# Patient Record
Sex: Female | Born: 1975 | Marital: Married | Smoking: Never smoker
Health system: Southern US, Community
[De-identification: ages and names within clinical notes are randomized; demographics above are authoritative.]

## PROBLEM LIST (undated history)

## (undated) DIAGNOSIS — J45909 Unspecified asthma, uncomplicated: Secondary | ICD-10-CM

## (undated) DIAGNOSIS — K589 Irritable bowel syndrome without diarrhea: Secondary | ICD-10-CM

## (undated) HISTORY — PX: TONSILLECTOMY: SUR1361

---

## 2019-04-29 ENCOUNTER — Ambulatory Visit: Payer: Self-pay | Admitting: Family Medicine

## 2019-04-29 DIAGNOSIS — Z0289 Encounter for other administrative examinations: Secondary | ICD-10-CM

## 2019-07-01 ENCOUNTER — Emergency Department (HOSPITAL_BASED_OUTPATIENT_CLINIC_OR_DEPARTMENT_OTHER): Payer: Commercial Managed Care - PPO

## 2019-07-01 ENCOUNTER — Other Ambulatory Visit: Payer: Self-pay

## 2019-07-01 ENCOUNTER — Emergency Department (HOSPITAL_BASED_OUTPATIENT_CLINIC_OR_DEPARTMENT_OTHER)
Admission: EM | Admit: 2019-07-01 | Discharge: 2019-07-01 | Disposition: A | Discharge status: Home / Self Care | Payer: Commercial Managed Care - PPO | Attending: Emergency Medicine | Admitting: Emergency Medicine

## 2019-07-01 ENCOUNTER — Encounter (HOSPITAL_BASED_OUTPATIENT_CLINIC_OR_DEPARTMENT_OTHER): Payer: Self-pay

## 2019-07-01 DIAGNOSIS — Z79899 Other long term (current) drug therapy: Secondary | ICD-10-CM | POA: Insufficient documentation

## 2019-07-01 DIAGNOSIS — J45909 Unspecified asthma, uncomplicated: Secondary | ICD-10-CM | POA: Diagnosis not present

## 2019-07-01 DIAGNOSIS — N12 Tubulo-interstitial nephritis, not specified as acute or chronic: Secondary | ICD-10-CM | POA: Insufficient documentation

## 2019-07-01 DIAGNOSIS — R109 Unspecified abdominal pain: Secondary | ICD-10-CM | POA: Diagnosis present

## 2019-07-01 HISTORY — DX: Unspecified asthma, uncomplicated: J45.909

## 2019-07-01 HISTORY — DX: Irritable bowel syndrome, unspecified: K58.9

## 2019-07-01 LAB — PREGNANCY, URINE: Preg Test, Ur: NEGATIVE

## 2019-07-01 LAB — CBC WITH DIFFERENTIAL/PLATELET
Abs Immature Granulocytes: 0.08 10*3/uL — ABNORMAL HIGH (ref 0.00–0.07)
Basophils Absolute: 0.1 10*3/uL (ref 0.0–0.1)
Basophils Relative: 0 %
Eosinophils Absolute: 0.4 10*3/uL (ref 0.0–0.5)
Eosinophils Relative: 3 %
HCT: 37.9 % (ref 36.0–46.0)
Hemoglobin: 13 g/dL (ref 12.0–15.0)
Immature Granulocytes: 1 %
Lymphocytes Relative: 21 %
Lymphs Abs: 2.7 10*3/uL (ref 0.7–4.0)
MCH: 31.5 pg (ref 26.0–34.0)
MCHC: 34.3 g/dL (ref 30.0–36.0)
MCV: 91.8 fL (ref 80.0–100.0)
Monocytes Absolute: 1 10*3/uL (ref 0.1–1.0)
Monocytes Relative: 7 %
Neutro Abs: 9.2 10*3/uL — ABNORMAL HIGH (ref 1.7–7.7)
Neutrophils Relative %: 68 %
Platelets: 323 10*3/uL (ref 150–400)
RBC: 4.13 MIL/uL (ref 3.87–5.11)
RDW: 12.8 % (ref 11.5–15.5)
WBC: 13.4 10*3/uL — ABNORMAL HIGH (ref 4.0–10.5)
nRBC: 0 % (ref 0.0–0.2)

## 2019-07-01 LAB — URINALYSIS, MICROSCOPIC (REFLEX)
RBC / HPF: >50 RBC/hpf (ref 0–5)
WBC, UA: >50 WBC/hpf (ref 0–5)

## 2019-07-01 LAB — URINALYSIS, ROUTINE W REFLEX MICROSCOPIC

## 2019-07-01 LAB — BASIC METABOLIC PANEL
Anion gap: 9 (ref 5–15)
BUN: 16 mg/dL (ref 6–20)
CO2: 22 mmol/L (ref 22–32)
Calcium: 9.2 mg/dL (ref 8.9–10.3)
Chloride: 110 mmol/L (ref 98–111)
Creatinine, Ser: 0.8 mg/dL (ref 0.44–1.00)
GFR calc Af Amer: >60 mL/min (ref >60)
GFR calc non Af Amer: >60 mL/min (ref >60)
Glucose, Bld: 95 mg/dL (ref 70–99)
Potassium: 4.2 mmol/L (ref 3.5–5.1)
Sodium: 141 mmol/L (ref 135–145)

## 2019-07-01 MED ORDER — ONDANSETRON 4 MG PO TBDP
8.0000 mg | ORAL_TABLET | Freq: Once | ORAL | Status: AC
  Administered 2019-07-01: 8 mg via ORAL
  Filled 2019-07-01: qty 2

## 2019-07-01 MED ORDER — PHENAZOPYRIDINE HCL 200 MG PO TABS: 200.0000 mg | ORAL_TABLET | Freq: Three times a day (TID) | ORAL | 0 refills | Status: AC | PRN

## 2019-07-01 MED ORDER — PHENAZOPYRIDINE HCL 100 MG PO TABS
95.0000 mg | ORAL_TABLET | Freq: Once | ORAL | Status: AC
  Administered 2019-07-01: 100 mg via ORAL
  Filled 2019-07-01: qty 1

## 2019-07-01 MED ORDER — CEPHALEXIN 500 MG PO CAPS: 500.0000 mg | ORAL_CAPSULE | Freq: Four times a day (QID) | ORAL | 0 refills | Status: AC

## 2019-07-01 MED ORDER — ONDANSETRON HCL 4 MG PO TABS
4.0000 mg | ORAL_TABLET | Freq: Four times a day (QID) | ORAL | 0 refills | Status: AC
Start: 2019-07-01 — End: ?

## 2019-07-01 MED ORDER — OXYCODONE-ACETAMINOPHEN 5-325 MG PO TABS
2.0000 | ORAL_TABLET | Freq: Once | ORAL | Status: AC
  Administered 2019-07-01: 2 via ORAL
  Filled 2019-07-01: qty 2

## 2019-07-01 MED ORDER — CEPHALEXIN 250 MG PO CAPS
1000.0000 mg | ORAL_CAPSULE | Freq: Once | ORAL | Status: AC
  Administered 2019-07-01: 1000 mg via ORAL
  Filled 2019-07-01: qty 4

## 2019-07-01 NOTE — ED Provider Notes (Signed)
[facility] EMERGENCY DEPARTMENT Provider Note   CSN: [id number] Arrival date & time: 07/01/19  0047     History Chief Complaint  Patient presents with  . Urinary Tract Infection    Jackie Costa is a 44 y.o. female.   Urinary Tract Infection Pain quality:  Stabbing and aching Pain severity:  Mild Duration:  3 days Timing:  Constant Progression:  Worsening Chronicity:  Recurrent Recent urinary tract infections: no   Ineffective treatments:  NSAIDs Urinary symptoms: hematuria   Associated symptoms: abdominal pain, flank pain and nausea   Associated symptoms: no vomiting   Risk factors: no hx of pyelonephritis        Past Medical History:  Diagnosis Date  . Asthma   . IBS (irritable bowel syndrome)    Constipation    There are no problems to display for this patient.   Past Surgical History:  Procedure Laterality Date  . CESAREAN SECTION    . TONSILLECTOMY       OB History   No obstetric history on file.     History reviewed. No pertinent family history.  Social History   Tobacco Use  . Smoking status: Never Smoker  . Smokeless tobacco: Never Used  Substance Use Topics  . Alcohol use: Not Currently  . Drug use: Not on file    Home Medications Prior to Admission medications   Medication Sig Start Date End Date Taking? Authorizing Provider  albuterol (VENTOLIN HFA) 108 (90 Base) MCG/ACT inhaler Inhale into the lungs every 6 (six) hours as needed for wheezing or shortness of breath.   Yes [provider]  montelukast (SINGULAIR) 10 MG tablet Take 10 mg by mouth at bedtime.   Yes [provider]  cephALEXin (KEFLEX) 500 MG capsule Take 1 capsule (500 mg total) by mouth 4 (four) times daily. 07/01/19   Milanna Kozlov, Barbara Cower, MD  ondansetron (ZOFRAN) 4 MG tablet Take 1 tablet (4 mg total) by mouth every 6 (six) hours. 07/01/19   Daylan Juhnke, Barbara Cower, MD  phenazopyridine (PYRIDIUM) 200 MG tablet Take 1 tablet (200 mg total) by mouth 3 (three)  times daily as needed for pain. 07/01/19   Tanaya Dunigan, Barbara Cower, MD    Allergies    Penicillins  Review of Systems   Review of Systems  Gastrointestinal: Positive for abdominal pain and nausea. Negative for vomiting.  Genitourinary: Positive for flank pain.  All other systems reviewed and are negative.   Physical Exam Updated Vital Signs BP 123/62 (BP Location: Left Arm)   Pulse 79   Temp 98.7 F (37.1 C)   Resp 16   Ht 5\' 4"  (1.626 m)   Wt 88.5 kg   LMP 06/09/2019 Comment: (-) urine preg//ac  SpO2 100%   BMI 33.47 kg/m   Physical Exam Vitals and nursing note reviewed.  Constitutional:      Appearance: She is well-developed.  HENT:     Head: Normocephalic and atraumatic.     Mouth/Throat:     Mouth: Mucous membranes are dry.     Pharynx: Oropharynx is clear.  Cardiovascular:     Rate and Rhythm: Normal rate and regular rhythm.  Pulmonary:     Effort: No respiratory distress.     Breath sounds: No stridor.  Abdominal:     General: There is no distension.  Musculoskeletal:        General: No swelling or tenderness. Normal range of motion.     Cervical back: Normal range of motion.  Skin:  General: Skin is warm and dry.  Neurological:     General: No focal deficit present.     Mental Status: She is alert.     ED Results / Procedures / Treatments   Labs (all labs ordered are listed, but only abnormal results are displayed) Labs Reviewed  URINALYSIS, ROUTINE W REFLEX MICROSCOPIC - Abnormal; Notable for the following components:      Result Value   Color, Urine RED (*)    APPearance TURBID (*)    Glucose, UA   (*)    Value: TEST NOT REPORTED DUE TO COLOR INTERFERENCE OF URINE PIGMENT   Hgb urine dipstick   (*)    Value: TEST NOT REPORTED DUE TO COLOR INTERFERENCE OF URINE PIGMENT   Bilirubin Urine   (*)    Value: TEST NOT REPORTED DUE TO COLOR INTERFERENCE OF URINE PIGMENT   Ketones, ur   (*)    Value: TEST NOT REPORTED DUE TO COLOR INTERFERENCE OF URINE  PIGMENT   Protein, ur   (*)    Value: TEST NOT REPORTED DUE TO COLOR INTERFERENCE OF URINE PIGMENT   Nitrite   (*)    Value: TEST NOT REPORTED DUE TO COLOR INTERFERENCE OF URINE PIGMENT   Leukocytes,Ua   (*)    Value: TEST NOT REPORTED DUE TO COLOR INTERFERENCE OF URINE PIGMENT   All other components within normal limits  URINALYSIS, MICROSCOPIC (REFLEX) - Abnormal; Notable for the following components:   Bacteria, UA MANY (*)    All other components within normal limits  CBC WITH DIFFERENTIAL/PLATELET - Abnormal; Notable for the following components:   WBC 13.4 (*)    Neutro Abs 9.2 (*)    Abs Immature Granulocytes 0.08 (*)    All other components within normal limits  BASIC METABOLIC PANEL  PREGNANCY, URINE    EKG None  Radiology CT Renal Stone Study  Result Date: 07/01/2019 CLINICAL DATA:  Flank pain and UTI EXAM: CT ABDOMEN AND PELVIS WITHOUT CONTRAST TECHNIQUE: Multidetector CT imaging of the abdomen and pelvis was performed following the standard protocol without IV contrast. COMPARISON:  None. FINDINGS: Lower chest: No acute abnormality. Hepatobiliary: No focal liver abnormality is seen. No gallstones, gallbladder wall thickening, or biliary dilatation. Pancreas: Unremarkable. No pancreatic ductal dilatation or surrounding inflammatory changes. Spleen: Normal in size without focal abnormality. Adrenals/Urinary Tract: Adrenal glands are within normal limits. Kidneys are well visualized. No definitive renal calculi are seen. The ureters are within normal limits bilaterally. The bladder is decompressed. Stomach/Bowel: The appendix is within normal limits. No obstructive or inflammatory changes of the colon are seen. The small bowel and stomach appear within normal limits Vascular/Lymphatic: No significant vascular findings are present. No enlarged abdominal or pelvic lymph nodes. Reproductive: Uterus is within normal limits. Cystic changes are noted in the ovaries. Surgical clip is noted  adjacent to the uterus likely related to the prior Caesarean section. Other: No abdominal wall hernia or abnormality. No abdominopelvic ascites. Musculoskeletal: No acute or significant osseous findings. IMPRESSION: No acute abnormality noted. Electronically Signed   By: Inez Catalina M.D.   On: 07/01/2019 03:55    Procedures Procedures (including critical care time)  Medications Ordered in ED Medications  oxyCODONE-acetaminophen (PERCOCET/ROXICET) 5-325 MG per tablet 2 tablet (2 tablets Oral Given 07/01/19 0221)  cephALEXin (KEFLEX) capsule 1,000 mg (1,000 mg Oral Given 07/01/19 0417)  phenazopyridine (PYRIDIUM) tablet 100 mg (100 mg Oral Given 07/01/19 0417)  ondansetron (ZOFRAN-ODT) disintegrating tablet 8 mg (8 mg Oral Given  07/01/19 0417)    ED Course  I have reviewed the triage vital signs and the nursing notes.  Pertinent labs & imaging results that were available during my care of the patient were reviewed by me and considered in my medical decision making (see chart for details).    MDM Rules/Calculators/A&P                      Likely pyelonephritis. No e/o complications. Tolerating PO. Symptoms improved in ED. VS WNL, no e/o sepsis. Stable for dc with pcp follow up, return for worsening symptoms.   Final Clinical Impression(s) / ED Diagnoses Final diagnoses:  Pyelonephritis    Rx / DC Orders ED Discharge Orders         Ordered    cephALEXin (KEFLEX) 500 MG capsule  4 times daily     07/01/19 0440    phenazopyridine (PYRIDIUM) 200 MG tablet  3 times daily PRN     07/01/19 0440    ondansetron (ZOFRAN) 4 MG tablet  Every 6 hours     07/01/19 0440           Lorraine Terriquez, Barbara Cower, MD 07/01/19 4854

## 2019-07-01 NOTE — ED Notes (Signed)
ED Provider at bedside. 

## 2019-07-01 NOTE — ED Triage Notes (Signed)
Pt states UTI pain ~4-5pm today. Urinating blood. Hx of UTI.

## 2019-08-05 ENCOUNTER — Other Ambulatory Visit (HOSPITAL_BASED_OUTPATIENT_CLINIC_OR_DEPARTMENT_OTHER): Payer: Self-pay | Admitting: Physician Assistant

## 2019-08-05 DIAGNOSIS — Z1231 Encounter for screening mammogram for malignant neoplasm of breast: Secondary | ICD-10-CM

## 2019-08-14 ENCOUNTER — Other Ambulatory Visit: Payer: Self-pay | Admitting: Obstetrics & Gynecology

## 2019-08-14 DIAGNOSIS — N979 Female infertility, unspecified: Secondary | ICD-10-CM

## 2019-09-15 ENCOUNTER — Other Ambulatory Visit: Payer: Commercial Managed Care - PPO

## 2019-09-16 ENCOUNTER — Emergency Department (HOSPITAL_BASED_OUTPATIENT_CLINIC_OR_DEPARTMENT_OTHER)
Admission: EM | Admit: 2019-09-16 | Discharge: 2019-09-16 | Disposition: A | Discharge status: Home / Self Care | Payer: Commercial Managed Care - PPO | Attending: Emergency Medicine | Admitting: Emergency Medicine

## 2019-09-16 ENCOUNTER — Other Ambulatory Visit: Payer: Self-pay

## 2019-09-16 ENCOUNTER — Encounter (HOSPITAL_BASED_OUTPATIENT_CLINIC_OR_DEPARTMENT_OTHER): Payer: Self-pay | Admitting: *Deleted

## 2019-09-16 DIAGNOSIS — Y999 Unspecified external cause status: Secondary | ICD-10-CM | POA: Insufficient documentation

## 2019-09-16 DIAGNOSIS — Z23 Encounter for immunization: Secondary | ICD-10-CM | POA: Insufficient documentation

## 2019-09-16 DIAGNOSIS — S0125XA Open bite of nose, initial encounter: Secondary | ICD-10-CM | POA: Diagnosis not present

## 2019-09-16 DIAGNOSIS — S0992XA Unspecified injury of nose, initial encounter: Secondary | ICD-10-CM | POA: Diagnosis present

## 2019-09-16 DIAGNOSIS — Y939 Activity, unspecified: Secondary | ICD-10-CM | POA: Diagnosis not present

## 2019-09-16 DIAGNOSIS — W540XXA Bitten by dog, initial encounter: Secondary | ICD-10-CM | POA: Diagnosis not present

## 2019-09-16 DIAGNOSIS — Y929 Unspecified place or not applicable: Secondary | ICD-10-CM | POA: Diagnosis not present

## 2019-09-16 NOTE — ED Triage Notes (Signed)
C/o dog bite to nose x 1 hr ago . 3 puncture wounds , bleeding controlled

## 2021-03-15 IMAGING — CT CT RENAL STONE PROTOCOL
2 of 4 series · 17 of 46 positions shown, 19 images · non-contrast
Comparison: None.

CLINICAL DATA: Flank pain and UTI

EXAM:
CT ABDOMEN AND PELVIS WITHOUT CONTRAST
TECHNIQUE: Multidetector CT imaging of the abdomen and pelvis was performed
following the standard protocol without IV contrast.

[Series 2: axial st · axial · 0.75mm/px · z∈[-443,-13]mm · 14 of 94 slices shown, 16 images]
[im 4/94  soft-tissue]
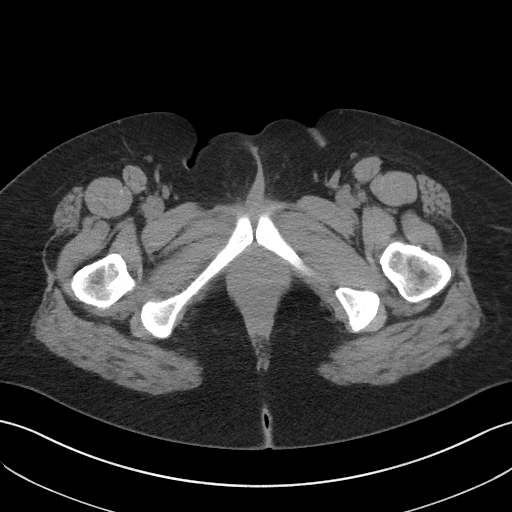
[im 4/94  bone]
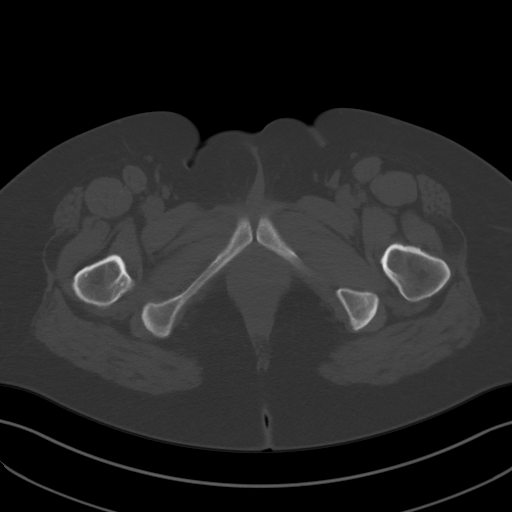
[im 12/94  soft-tissue]
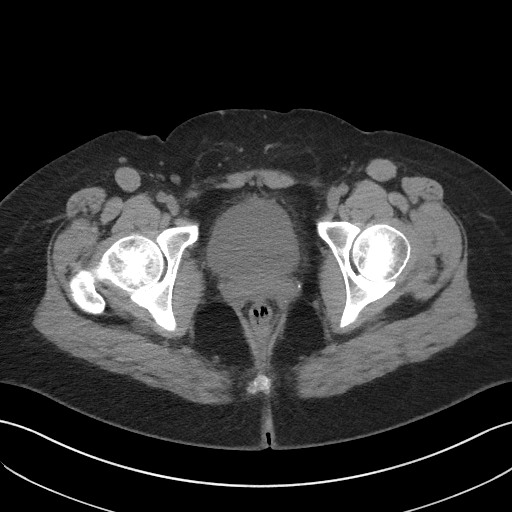
[im 20/94  soft-tissue]
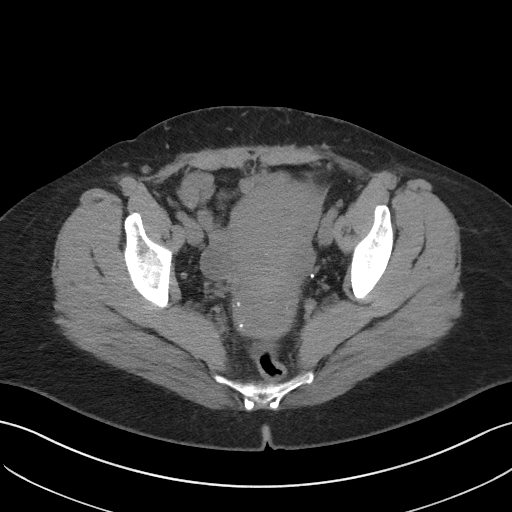
[im 24/94  soft-tissue]
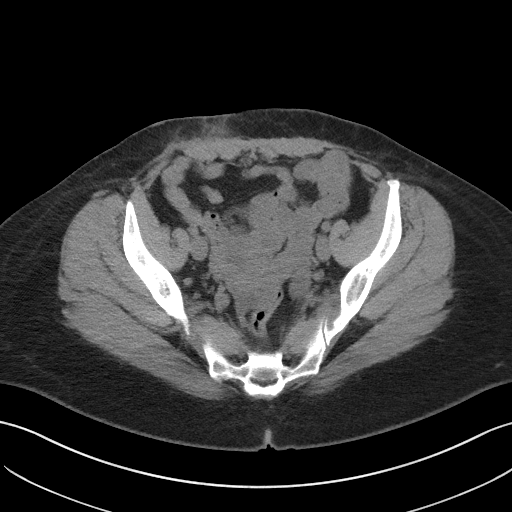
[im 32/94  soft-tissue]
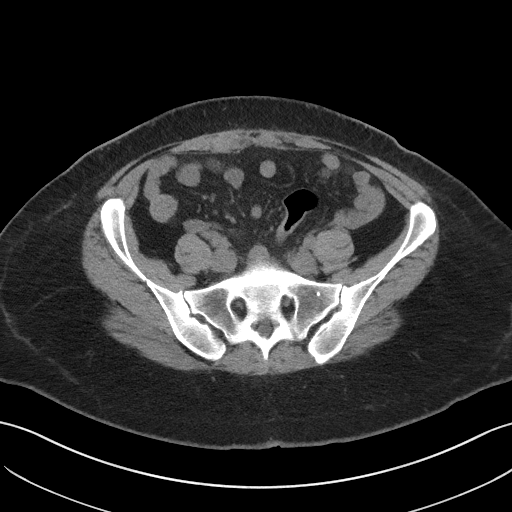
[im 39/94  soft-tissue]
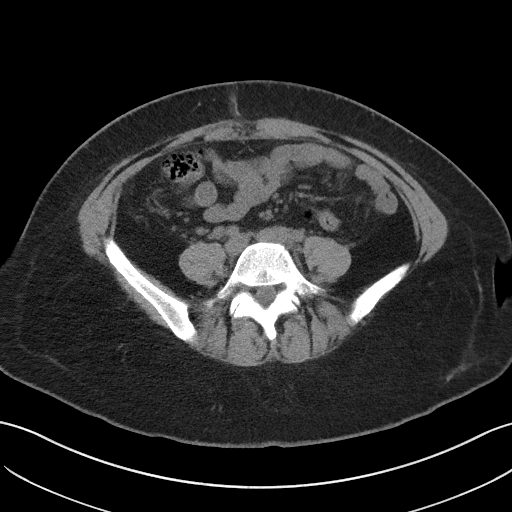
[im 43/94  soft-tissue]
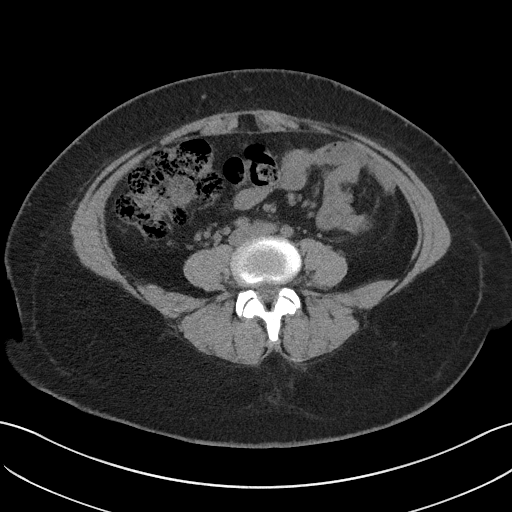
[im 51/94  soft-tissue]
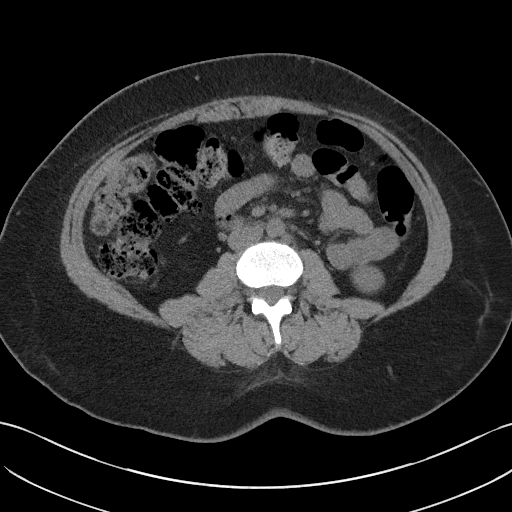
[im 55/94  soft-tissue]
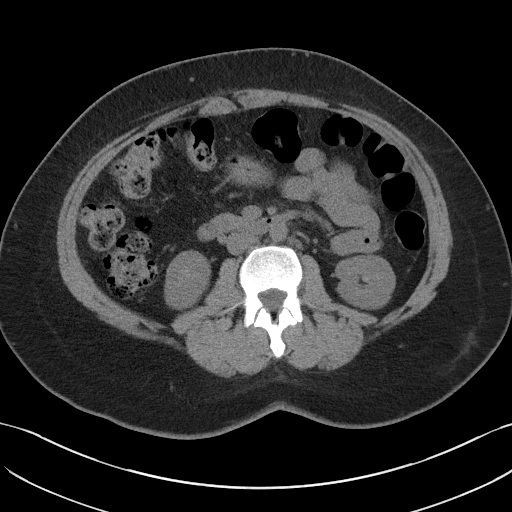
[im 55/94  bone]
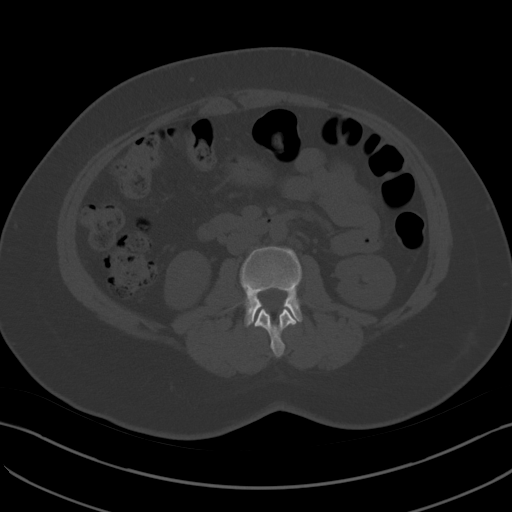
[im 63/94  soft-tissue]
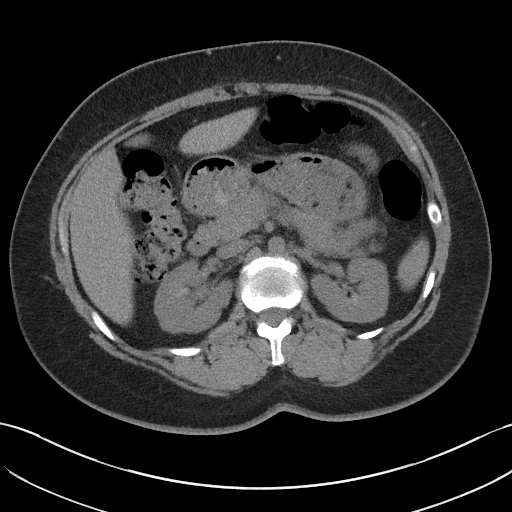
[im 70/94  soft-tissue]
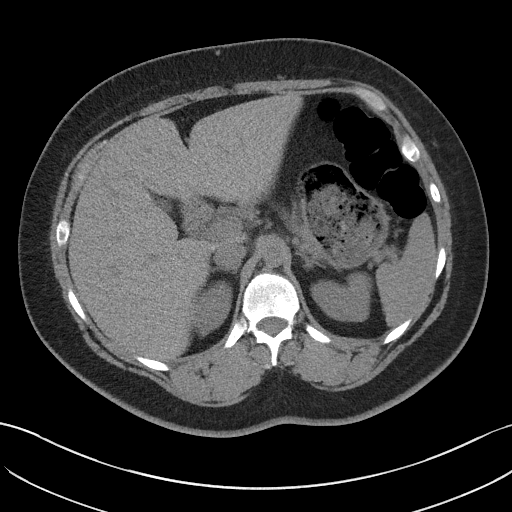
[im 74/94  soft-tissue]
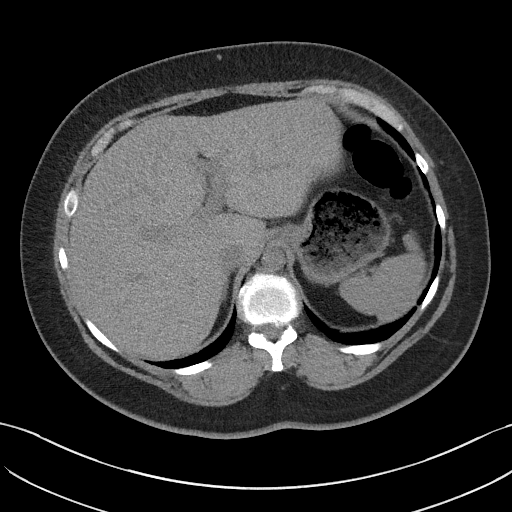
[im 82/94  soft-tissue]
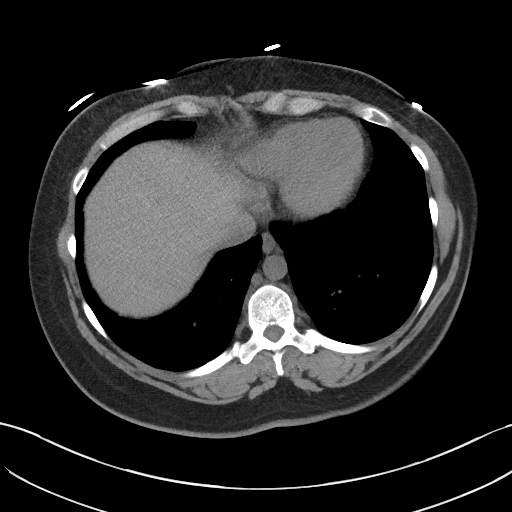
[im 90/94  soft-tissue]
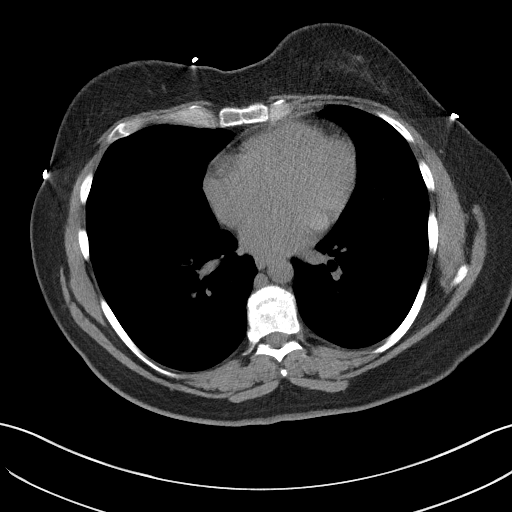

[Series 4: coronal st · coronal · 0.92mm/px · 3 of 83 slices shown]
[im 28/83  soft-tissue]
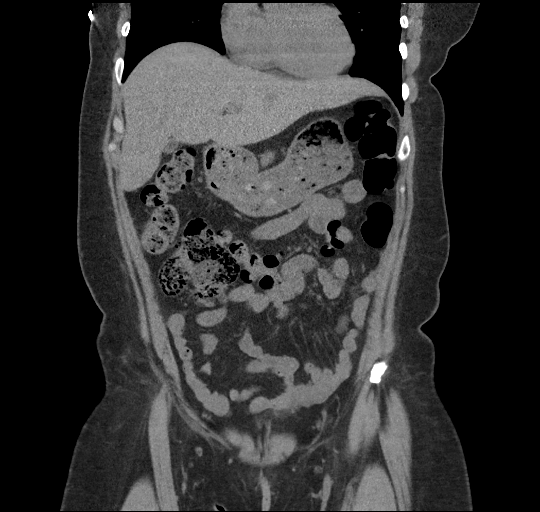
[im 37/83  soft-tissue]
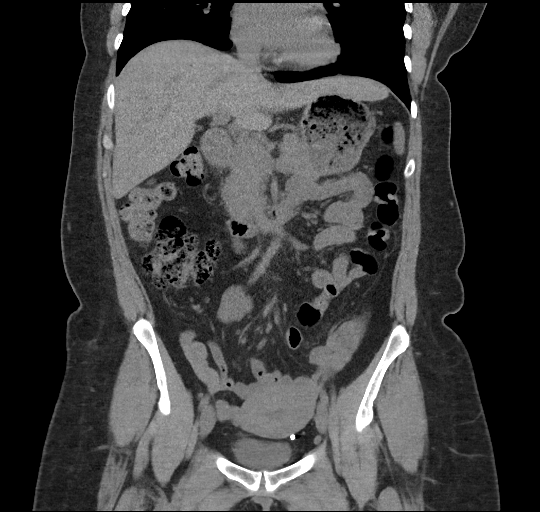
[im 46/83  soft-tissue]
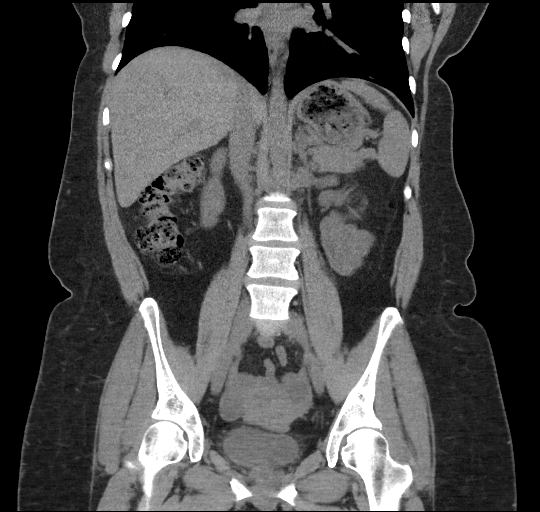

[17 of 46 positions shown; findings below may reference images not displayed]

FINDINGS: Lower chest: No acute abnormality.

Hepatobiliary: No focal liver abnormality is seen. No gallstones,
gallbladder wall thickening, or biliary dilatation.

Pancreas: Unremarkable. No pancreatic ductal dilatation or
surrounding inflammatory changes.

Spleen: Normal in size without focal abnormality.

Adrenals/Urinary Tract: Adrenal glands are within normal limits.
Kidneys are well visualized. No definitive renal calculi are seen.
The ureters are within normal limits bilaterally. The bladder is
decompressed.

Stomach/Bowel: The appendix is within normal limits. No obstructive
or inflammatory changes of the colon are seen. The small bowel and
stomach appear within normal limits

Vascular/Lymphatic: No significant vascular findings are present. No
enlarged abdominal or pelvic lymph nodes.

Reproductive: Uterus is within normal limits. Cystic changes are
noted in the ovaries. Surgical clip is noted adjacent to the uterus
likely related to the prior Caesarean section.

Other: No abdominal wall hernia or abnormality. No abdominopelvic
ascites.

Musculoskeletal: No acute or significant osseous findings.
IMPRESSION: No acute abnormality noted.

## 2022-02-02 DIAGNOSIS — R0602 Shortness of breath: Secondary | ICD-10-CM | POA: Diagnosis not present

## 2022-03-15 ENCOUNTER — Ambulatory Visit
Admission: EM | Admit: 2022-03-15 | Discharge: 2022-03-15 | Disposition: A | Discharge status: Home / Self Care | Payer: Commercial Managed Care - PPO | Attending: Nurse Practitioner | Admitting: Nurse Practitioner

## 2022-03-15 DIAGNOSIS — J45909 Unspecified asthma, uncomplicated: Secondary | ICD-10-CM | POA: Insufficient documentation

## 2022-03-15 DIAGNOSIS — B349 Viral infection, unspecified: Secondary | ICD-10-CM | POA: Insufficient documentation

## 2022-03-15 DIAGNOSIS — R051 Acute cough: Secondary | ICD-10-CM | POA: Insufficient documentation

## 2022-03-15 DIAGNOSIS — Z1152 Encounter for screening for COVID-19: Secondary | ICD-10-CM | POA: Diagnosis not present

## 2022-03-15 LAB — POCT INFLUENZA A/B
Influenza A, POC: NEGATIVE
Influenza B, POC: NEGATIVE

## 2022-03-15 NOTE — Discharge Instructions (Signed)
Your symptoms and exam are consistent for a viral illness. Please treat your symptoms with over the counter cough medication, tylenol or ibuprofen, humidifier, and rest. Viral illnesses can last 7-14 days. Please follow up with your PCP if your symptoms are not improving. Please go to the ER for any worsening symptoms. This includes but is not limited to fever you can not control with tylenol or ibuprofen, you are not able to stay hydrated, you have shortness of breath or chest pain.  Thank you for choosing [facility] for your healthcare needs. I hope you feel better soon!

## 2022-03-15 NOTE — ED Triage Notes (Signed)
Pt states Saturday she began having body aches, SHOB, congestion and cough.  Home interventions: mucinex, dayquil, nyquil

## 2022-03-15 NOTE — ED Provider Notes (Signed)
[facility]    CSN: [id number] Arrival date & time: 03/15/22  [id number]      History   Chief Complaint Chief Complaint  Patient presents with   Shortness of Breath   Generalized Body Aches   Cough   Nasal Congestion    HPI Jackie Costa is a 47 y.o. female who presents for evaluation of URI symptoms for 4-5 days. Patient reports associated symptoms of bodyaches, cough, congestion, shortness of breath, ear pain. Denies N/V, sore throat. Patient does have a hx of asthma.  Has been using albuterol inhaler and Xopenex with temporary improvement of her shortness of breath. no smoking.  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  Pt is vaccinated for COVID. Pt is vaccinated for flu this season. Pt has taken Mucinex, DayQuil, NyQuil OTC for symptoms. Pt has no other concerns at this time.    Shortness of Breath Associated symptoms: cough and ear pain   Cough Associated symptoms: ear pain, myalgias and shortness of breath     Past Medical History:  Diagnosis Date   Asthma    IBS (irritable bowel syndrome)    Constipation    There are no problems to display for this patient.   Past Surgical History:  Procedure Laterality Date   CESAREAN SECTION     TONSILLECTOMY      OB History   No obstetric history on file.      Home Medications    Prior to Admission medications   Medication Sig Start Date End Date Taking? Authorizing Provider  levalbuterol [facility] HFA) 45 MCG/ACT inhaler Inhale into the lungs every 4 (four) hours as needed for wheezing.   Yes [provider]  albuterol (VENTOLIN HFA) 108 (90 Base) MCG/ACT inhaler Inhale into the lungs every 6 (six) hours as needed for wheezing or shortness of breath.    [provider]  cephALEXin (KEFLEX) 500 MG capsule Take 1 capsule (500 mg total) by mouth 4 (four) times daily. 07/01/19   Mesner, Corene Cornea, MD  levothyroxine (SYNTHROID) 50 MCG tablet Take 1 tablet by mouth  daily.    [provider]  montelukast (SINGULAIR) 10 MG tablet Take 10 mg by mouth at bedtime.    [provider]  ondansetron (ZOFRAN) 4 MG tablet Take 1 tablet (4 mg total) by mouth every 6 (six) hours. 07/01/19   Mesner, Corene Cornea, MD  phenazopyridine (PYRIDIUM) 200 MG tablet Take 1 tablet (200 mg total) by mouth 3 (three) times daily as needed for pain. 07/01/19   Mesner, Corene Cornea, MD    Family History History reviewed. No pertinent family history.  Social History Social History   Tobacco Use   Smoking status: Never   Smokeless tobacco: Never  Substance Use Topics   Alcohol use: Not Currently     Allergies   Morphine and Penicillins   Review of Systems Review of Systems  HENT:  Positive for congestion and ear pain.   Respiratory:  Positive for cough and shortness of breath.   Musculoskeletal:  Positive for myalgias.     Physical Exam Triage Vital Signs ED Triage Vitals  Enc Vitals Group     BP --      Pulse Rate 03/15/22 0838 83     Resp 03/15/22 0838 18     Temp 03/15/22 0838 98.4 F (36.9 C)     Temp Source 03/15/22 0838 Oral     SpO2 03/15/22 0838 98 %     Weight --  Height --      Head Circumference --      Peak Flow --      Pain Score 03/15/22 0839 3     Pain Loc --      Pain Edu? --      Excl. in [place]? --    No data found.  Updated Vital Signs Pulse 83   Temp 98.4 F (36.9 C) (Oral)   Resp 18   LMP 02/18/2022 (Approximate)   SpO2 98%   Visual Acuity Right Eye Distance:   Left Eye Distance:   Bilateral Distance:    Right Eye Near:   Left Eye Near:    Bilateral Near:     Physical Exam Vitals and nursing note reviewed.  Constitutional:      General: She is not in acute distress.    Appearance: She is well-developed. She is not ill-appearing.  HENT:     Head: Normocephalic and atraumatic.     Right Ear: Tympanic membrane and ear canal normal.     Left Ear: Tympanic membrane and ear canal normal.     Nose: Congestion  present.     Mouth/Throat:     Mouth: Mucous membranes are moist.     Pharynx: Oropharynx is clear. Uvula midline. Posterior oropharyngeal erythema present.     Tonsils: No tonsillar exudate or tonsillar abscesses.  Eyes:     Conjunctiva/sclera: Conjunctivae normal.     Pupils: Pupils are equal, round, and reactive to light.  Cardiovascular:     Rate and Rhythm: Normal rate and regular rhythm.     Heart sounds: Normal heart sounds.  Pulmonary:     Effort: Pulmonary effort is normal.     Breath sounds: Wheezing present.     Comments: Mild expiratory wheezing right lower base. Musculoskeletal:     Cervical back: Normal range of motion and neck supple.  Lymphadenopathy:     Cervical: No cervical adenopathy.  Skin:    General: Skin is warm and dry.  Neurological:     General: No focal deficit present.     Mental Status: She is alert and oriented to person, place, and time.  Psychiatric:        Mood and Affect: Mood normal.        Behavior: Behavior normal.      UC Treatments / Results  Labs (all labs ordered are listed, but only abnormal results are displayed) Labs Reviewed  SARS CORONAVIRUS 2 (TAT 6-24 HRS)  POCT INFLUENZA A/B    EKG   Radiology No results found.  Procedures Procedures (including critical care time)  Medications Ordered in UC Medications - No data to display  Initial Impression / Assessment and Plan / UC Course  I have reviewed the triage vital signs and the nursing notes.  Pertinent labs & imaging results that were available during my care of the patient were reviewed by me and considered in my medical decision making (see chart for details).     Patient declined DuoNeb in clinic Negative rapid flu.  COVID PCR and will contact if positive Discussed viral illness and symptomatic treatment Rest and fluids Patient prefers over-the-counter cough medicine will continue Mucinex Continue inhaler as needed PCP follow-up in 2 to 3 days for  recheck ER precautions reviewed and patient verbalized understanding Final Clinical Impressions(s) / UC Diagnoses   Final diagnoses:  Acute cough  Viral illness     Discharge Instructions      Your symptoms and exam are  consistent for a viral illness. Please treat your symptoms with over the counter cough medication, tylenol or ibuprofen, humidifier, and rest. Viral illnesses can last 7-14 days. Please follow up with your PCP if your symptoms are not improving. Please go to the ER for any worsening symptoms. This includes but is not limited to fever you can not control with tylenol or ibuprofen, you are not able to stay hydrated, you have shortness of breath or chest pain.  Thank you for choosing [facility] for your healthcare needs. I hope you feel better soon!      ED Prescriptions   None    PDMP not reviewed this encounter.   Melynda Ripple, NP 03/15/22 [phone]

## 2022-03-16 LAB — SARS CORONAVIRUS 2 (TAT 6-24 HRS): SARS Coronavirus 2: NEGATIVE

## 2022-06-28 ENCOUNTER — Other Ambulatory Visit: Payer: Self-pay

## 2022-06-28 ENCOUNTER — Emergency Department (HOSPITAL_BASED_OUTPATIENT_CLINIC_OR_DEPARTMENT_OTHER)
Admission: EM | Admit: 2022-06-28 | Discharge: 2022-06-28 | Disposition: A | Discharge status: Home / Self Care | Payer: Commercial Managed Care - PPO | Attending: Emergency Medicine | Admitting: Emergency Medicine

## 2022-06-28 ENCOUNTER — Ambulatory Visit (INDEPENDENT_AMBULATORY_CARE_PROVIDER_SITE_OTHER)
Admission: EM | Admit: 2022-06-28 | Discharge: 2022-06-28 | Disposition: A | Discharge status: Home / Self Care | Payer: Commercial Managed Care - PPO | Source: Home / Self Care

## 2022-06-28 ENCOUNTER — Encounter (HOSPITAL_BASED_OUTPATIENT_CLINIC_OR_DEPARTMENT_OTHER): Payer: Self-pay

## 2022-06-28 DIAGNOSIS — R590 Localized enlarged lymph nodes: Secondary | ICD-10-CM | POA: Insufficient documentation

## 2022-06-28 DIAGNOSIS — H5711 Ocular pain, right eye: Secondary | ICD-10-CM | POA: Diagnosis present

## 2022-06-28 DIAGNOSIS — R070 Pain in throat: Secondary | ICD-10-CM | POA: Insufficient documentation

## 2022-06-28 DIAGNOSIS — M542 Cervicalgia: Secondary | ICD-10-CM | POA: Diagnosis not present

## 2022-06-28 DIAGNOSIS — H109 Unspecified conjunctivitis: Secondary | ICD-10-CM

## 2022-06-28 DIAGNOSIS — R519 Headache, unspecified: Secondary | ICD-10-CM

## 2022-06-28 LAB — POCT RAPID STREP A (OFFICE): Rapid Strep A Screen: NEGATIVE

## 2022-06-28 MED ORDER — TOBRAMYCIN 0.3 % OP SOLN: 1.0000 [drp] | OPHTHALMIC | 0 refills | Status: AC

## 2022-06-28 MED ORDER — FLUORESCEIN SODIUM 1 MG OP STRP
1.0000 | ORAL_STRIP | Freq: Once | OPHTHALMIC | Status: AC
  Administered 2022-06-28: 1 via OPHTHALMIC
  Filled 2022-06-28: qty 1

## 2022-06-28 MED ORDER — DEXAMETHASONE SODIUM PHOSPHATE 10 MG/ML IJ SOLN
10.0000 mg | Freq: Once | INTRAMUSCULAR | Status: AC
  Administered 2022-06-28: 10 mg via INTRAMUSCULAR

## 2022-06-28 MED ORDER — TETRACAINE HCL 0.5 % OP SOLN
2.0000 [drp] | Freq: Once | OPHTHALMIC | Status: AC
  Administered 2022-06-28: 2 [drp] via OPHTHALMIC
  Filled 2022-06-28: qty 4

## 2022-06-28 NOTE — ED Notes (Addendum)
Pt states he woke up with excruciating facial pain on rt. Side including neck.  Associated with a headache, then noticed her eye swollen and matted shut and hurting. Denies any trauma to eye.

## 2022-06-28 NOTE — ED Triage Notes (Signed)
Pt reports R sided facial pain/swelling since this morning. Pt seen at [facility] for possible pink eye and sent here to r/o migraine.

## 2022-06-28 NOTE — ED Notes (Signed)
Woods lamp and [place] at bedside.  

## 2022-06-28 NOTE — ED Triage Notes (Addendum)
Pt c/o right eye redness/drainage, pain to right side of face/ear/jaw line and HA-sx started this am-states she gets pink eye often but feels worse-no pain meds PTA-NAD-steady gait

## 2022-06-28 NOTE — ED Provider Notes (Signed)
[facility]  Note:  This document was prepared using Conservation officer, historic buildings and may include unintentional dictation errors.  MRN: [id number] DOB: 12-26-1975  Subjective:   Jackie Costa is a 47 y.o. female presenting for acute onset this morning of right-sided neck pain, right-sided lymph node pain.  Patient went to sleep and when she woke up had significant pain of the right eye, felt like it was swollen shut, has been watering and had photophobia.  She is now having right-sided facial pain, right-sided headache and ongoing neck and lymph node pain of the right side.  Did not feel sick at all prior to this.  No history of migraines.  She has had bouts of pinkeye before and believes that this is one of the problems she has now.    No current facility-administered medications for this encounter.  Current Outpatient Medications:    albuterol (VENTOLIN HFA) 108 (90 Base) MCG/ACT inhaler, Inhale into the lungs every 6 (six) hours as needed for wheezing or shortness of breath., Disp: , Rfl:    cephALEXin (KEFLEX) 500 MG capsule, Take 1 capsule (500 mg total) by mouth 4 (four) times daily., Disp: 40 capsule, Rfl: 0   levalbuterol (XOPENEX HFA) 45 MCG/ACT inhaler, Inhale into the lungs every 4 (four) hours as needed for wheezing., Disp: , Rfl:    levothyroxine (SYNTHROID) 50 MCG tablet, Take 1 tablet by mouth daily., Disp: , Rfl:    montelukast (SINGULAIR) 10 MG tablet, Take 10 mg by mouth at bedtime., Disp: , Rfl:    ondansetron (ZOFRAN) 4 MG tablet, Take 1 tablet (4 mg total) by mouth every 6 (six) hours., Disp: 12 tablet, Rfl: 0   phenazopyridine (PYRIDIUM) 200 MG tablet, Take 1 tablet (200 mg total) by mouth 3 (three) times daily as needed for pain., Disp: 6 tablet, Rfl: 0   Allergies  Allergen Reactions   Morphine Other (See Comments) and Shortness Of Breath    No idea   ? - patient denies anaphylactic reaction   Penicillins     Past Medical History:   Diagnosis Date   Asthma    IBS (irritable bowel syndrome)    Constipation     Past Surgical History:  Procedure Laterality Date   CESAREAN SECTION     TONSILLECTOMY      No family history on file.  Social History   Tobacco Use   Smoking status: Never   Smokeless tobacco: Never  Vaping Use   Vaping Use: Never used  Substance Use Topics   Alcohol use: Not Currently    ROS   Objective:   Vitals: BP (!) 140/75 (BP Location: Right Arm)   Pulse 81   Temp 97.8 F (36.6 C) (Oral)   Resp 20   LMP 06/25/2022   SpO2 96%   Physical Exam Constitutional:      General: She is not in acute distress.    Appearance: Normal appearance. She is well-developed and normal weight. She is not ill-appearing, toxic-appearing or diaphoretic.  HENT:     Head: Normocephalic and atraumatic.     Right Ear: Tympanic membrane, ear canal and external ear normal. No drainage or tenderness. No middle ear effusion. There is no impacted cerumen. Tympanic membrane is not erythematous or bulging.     Left Ear: Tympanic membrane, ear canal and external ear normal. No drainage or tenderness.  No middle ear effusion. There is no impacted cerumen. Tympanic membrane is not erythematous or bulging.  Nose: Nose normal. No congestion or rhinorrhea.     Mouth/Throat:     Mouth: Mucous membranes are moist. No oral lesions.     Pharynx: No pharyngeal swelling, oropharyngeal exudate, posterior oropharyngeal erythema or uvula swelling.     Tonsils: No tonsillar exudate or tonsillar abscesses. 0 on the right. 0 on the left.  Eyes:     General: Lids are normal. Lids are everted, no foreign bodies appreciated. Vision grossly intact. No scleral icterus.       Right eye: No foreign body, discharge or hordeolum.        Left eye: No foreign body, discharge or hordeolum.     Extraocular Movements: Extraocular movements intact.     Right eye: Normal extraocular motion.     Left eye: Normal extraocular motion and no  nystagmus.     Conjunctiva/sclera:     Right eye: Right conjunctiva is injected (with watering). No chemosis, exudate or hemorrhage.    Left eye: Left conjunctiva is not injected. No chemosis, exudate or hemorrhage. Cardiovascular:     Rate and Rhythm: Normal rate.  Pulmonary:     Effort: Pulmonary effort is normal.  Musculoskeletal:     Cervical back: Normal range of motion and neck supple.  Lymphadenopathy:     Cervical: Cervical adenopathy (right sided) present.  Skin:    General: Skin is warm and dry.  Neurological:     General: No focal deficit present.     Mental Status: She is alert and oriented to person, place, and time.     Cranial Nerves: No cranial nerve deficit.     Motor: No weakness.     Coordination: Coordination normal.     Gait: Gait normal.  Psychiatric:        Mood and Affect: Mood normal.        Behavior: Behavior normal.     Results for orders placed or performed during the hospital encounter of 06/28/22 (from the past 24 hour(s))  POCT rapid strep A     Status: None   Collection Time: 06/28/22  6:13 PM  Result Value Ref Range   Rapid Strep A Screen Negative Negative   IM dexamethasone 10 mg administered in clinic.  Declined Toradol injection.  Assessment and Plan :   PDMP not reviewed this encounter.  1. Bacterial conjunctivitis of right eye   2. Throat pain   3. Right-sided headache   4. Cervical lymphadenopathy     Patient warrants further workup for rule out of an acute encephalopathy, meningitis, aneurysm, cluster headache, ocular migraine among other medical emergencies.  I did provide patient with a prescription for medicine to address bacterial conjunctivitis of the right eye.  Patient did not have a rash and therefore low suspicion for shingles.  She contracts for safety and will present to the emergency room now.   Wallis Bamberg, [place] 06/28/22 1610

## 2022-06-28 NOTE — Discharge Instructions (Signed)
You were evaluated for eye redness and pain with headache and lymph node swelling. There is no evidence of any deep neck or dental infection causing this but if things worsen you can return to the ED for labs and neck imaging. There was no abrasion to the eye. The pressures were a little high. We spoke with Dr. Dairl Ponder at [facility]  [phone]. He is able to see you on 5/30 or 5/31. You should call and schedule with him to rule out acute angle closure glaucoma. He will also be able to evaluate for other infectious causes of your eye redness. Continue taking the antibiotics given from urgent care. If things worsen tonight you can come back to the ED otherwise evaluation of the eye is best done in the ophthalmology office.

## 2022-06-28 NOTE — ED Notes (Signed)
Patient is being discharged from the Urgent Care and sent to the Emergency Department via POV . Per Man, PA, patient is in need of higher level of care due to acute encephalopathy. Patient is aware and verbalizes understanding of plan of care.  Vitals:   06/28/22 1753  BP: (!) 140/75  Pulse: 81  Resp: 20  Temp: 97.8 F (36.6 C)  SpO2: 96%

## 2022-06-28 NOTE — ED Provider Notes (Signed)
[place] [facility] AT [facility] Provider Note   CSN: [id number] Arrival date & time: 06/28/22  1918     History  Chief Complaint  Patient presents with   Facial Swelling    Jackie Costa is a 47 y.o. female.  Patient presents to the ED after being seen by urgent care. She works the night shift as a Engineer, civil (consulting) and says when she woke up her R eye was red,painful and crusted over, she had a R hemicranial throbbing headache, R submandibular pain and swelling with pain in the neck. She has had associated photophobia. She says she gets 3-4 episodes of conjunctivitis per year and it is typical to have the photophobia and eye crusting.She has also had blurring of her vision in her R eye but denies foreign body sensation. She wears monthly contacts and takes them out maybe 3 times per month. She denies any fever, chills, focal weakness or sensory changes, any facial droop, any dental infection, any URI symptoms, N/V. She denies neck stiffness as well. At urgent care she got a steroid injection which brought the headache down to a 5/10 but did not take it away.        Home Medications Prior to Admission medications   Medication Sig Start Date End Date Taking? Authorizing Provider  albuterol (VENTOLIN HFA) 108 (90 Base) MCG/ACT inhaler Inhale into the lungs every 6 (six) hours as needed for wheezing or shortness of breath.    [provider]  cephALEXin (KEFLEX) 500 MG capsule Take 1 capsule (500 mg total) by mouth 4 (four) times daily. 07/01/19   Mesner, Barbara Cower, MD  levalbuterol [facility] HFA) 45 MCG/ACT inhaler Inhale into the lungs every 4 (four) hours as needed for wheezing.    [provider]  levothyroxine (SYNTHROID) 50 MCG tablet Take 1 tablet by mouth daily.    [provider]  montelukast (SINGULAIR) 10 MG tablet Take 10 mg by mouth at bedtime.    [provider]  ondansetron (ZOFRAN) 4 MG tablet Take 1 tablet (4 mg total) by mouth  every 6 (six) hours. 07/01/19   Mesner, Barbara Cower, MD  phenazopyridine (PYRIDIUM) 200 MG tablet Take 1 tablet (200 mg total) by mouth 3 (three) times daily as needed for pain. 07/01/19   Mesner, Barbara Cower, MD  tobramycin (TOBREX) 0.3 % ophthalmic solution Place 1 drop into the right eye every 4 (four) hours. 06/28/22   Wallis Bamberg, PA-C      Allergies    Morphine, Penicillins, and Toradol [ketorolac tromethamine]    Review of Systems   Review of Systems  All other systems reviewed and are negative.   Physical Exam Updated Vital Signs BP (!) 143/90 (BP Location: Right Arm)   Pulse 76   Temp 98.2 F (36.8 C) (Oral)   Resp 18   Ht 5\' 4"  (1.626 m)   Wt 81.6 kg   LMP 06/25/2022 (Exact Date)   SpO2 100%   BMI 30.90 kg/m  Physical Exam Constitutional:      General: She is in acute distress.     Appearance: She is obese. She is not ill-appearing or diaphoretic.  HENT:     Head: Normocephalic and atraumatic.     Mouth/Throat:     Mouth: Mucous membranes are moist.     Pharynx: Oropharynx is clear. No oropharyngeal exudate or posterior oropharyngeal erythema.     Comments: No evidence of dental infection, tonsillar erythema or swelling, peritonsillar abscess Eyes:     General:  No scleral icterus.       Right eye: Discharge present.        Left eye: No discharge.     Extraocular Movements: Extraocular movements intact.     Pupils: Pupils are equal, round, and reactive to light.     Comments: Conjunctival erythema of the R eye, no ciliary flush, no corneal opacification or white spot. Purulent discharge noted at the R eye. No soft tissue swelling or erythema of the eyelid or surrounding skin, no proptosis. Consensual photophobia noted. No corneal abrasions on fluorescein dye exam. R ey pressures 24-27.  Neck:     Comments: Able to touch chin to chest. R submandibular swelling with palpable lymphadenopathy.  Cardiovascular:     Rate and Rhythm: Normal rate and regular rhythm.     Pulses: Normal  pulses.     Heart sounds: Normal heart sounds. No murmur heard.    No friction rub. No gallop.  Pulmonary:     Effort: Pulmonary effort is normal. No respiratory distress.     Breath sounds: Normal breath sounds. No wheezing or rales.  Musculoskeletal:     Cervical back: Normal range of motion and neck supple. No rigidity.  Skin:    General: Skin is warm and dry.  Neurological:     Mental Status: She is alert.     Cranial Nerves: No cranial nerve deficit.     ED Results / Procedures / Treatments   Labs (all labs ordered are listed, but only abnormal results are displayed) Labs Reviewed - No data to display  EKG None  Radiology No results found.  Procedures Procedures    Medications Ordered in ED Medications - No data to display  ED Course/ Medical Decision Making/ A&P                             Medical Decision Making Patient presents with red eye, decreased visual acuity, photophobia, migraine like headache in the setting of contact use, without foreign body sensation, neck stiffness, focal neurologic deficits. Given no fixed dilated pupil low suspicion for acute angle closure glaucoma. Given no ciliary flush or corneal opacification and no foreign body sensation low concern for infectious keratitis. Given no miosis or ptosis no concern for carotid dissection. Given HDS, afebrile, no evidence of dental infection low concern for Lemierre or deep neck space infection. No focal neurologic deficits to suggest stroke. Unilateral headache without neck stiffness or fever makes meningitis less likely. Time course not consistent with cluster headache. No evidence of lid swelling to suggest blepharitis, no surrounding orbital erythema or impaired EOM or proptosis to suggest pre or post septal cellulitis. Do not think this is salivary gland infection given associated eye involvement. Overall suspect bacterial conjunctivitis causing submandibular lymphadenopathy and migraine like  headache. Will rule out any corneal injury or elevated pressures. Discussed with patient who does not want labs or further CT imaging of the neck at this time but will return if picture worsens or does not improve.  Update: Performed fluorescein dye with no evidence of corneal abrasion. Spoke with Dr. Darcel Bayley with ophthalmology who will see patient in clinic Thursday or Friday to rule out acute angle closure glaucoma. Patient will be given the number to schedule and should come to the ED for acute nightime worsening or call ophthalmology if worsening during day time.  Risk Prescription drug management.    Final Clinical Impression(s) / ED Diagnoses Final diagnoses:  None    Rx / DC Orders ED Discharge Orders     None         Willette Cluster, MD 06/28/22 2141    Tegeler, Canary Brim, MD 06/30/22 [phone]

## 2022-06-30 LAB — CULTURE, GROUP A STREP (THRC)

## 2022-07-01 LAB — CULTURE, GROUP A STREP (THRC)

## 2023-12-05 DIAGNOSIS — J069 Acute upper respiratory infection, unspecified: Secondary | ICD-10-CM | POA: Diagnosis not present

## 2023-12-05 DIAGNOSIS — H10023 Other mucopurulent conjunctivitis, bilateral: Secondary | ICD-10-CM | POA: Diagnosis not present
